# Patient Record
Sex: Male | Born: 1955 | Race: Black or African American | Hispanic: No | Marital: Married
Health system: Southern US, Community
[De-identification: ages and names within clinical notes are randomized; demographics above are authoritative.]

---

## 2003-05-11 ENCOUNTER — Encounter: Admission: RE | Admit: 2003-05-11 | Discharge: 2003-05-11 | Payer: Self-pay | Admitting: Occupational Medicine

## 2020-06-18 ENCOUNTER — Emergency Department (HOSPITAL_COMMUNITY)
Admission: EM | Admit: 2020-06-18 | Discharge: 2020-06-19 | Disposition: A | Discharge status: Home / Self Care | Payer: 59 | Attending: Emergency Medicine | Admitting: Emergency Medicine

## 2020-06-18 ENCOUNTER — Other Ambulatory Visit: Payer: Self-pay

## 2020-06-18 ENCOUNTER — Emergency Department (HOSPITAL_COMMUNITY): Payer: 59

## 2020-06-18 DIAGNOSIS — R42 Dizziness and giddiness: Secondary | ICD-10-CM | POA: Diagnosis not present

## 2020-06-18 DIAGNOSIS — R350 Frequency of micturition: Secondary | ICD-10-CM

## 2020-06-18 LAB — BASIC METABOLIC PANEL
Anion gap: 9 (ref 5–15)
BUN: 17 mg/dL (ref 8–23)
CO2: 26 mmol/L (ref 22–32)
Calcium: 9.5 mg/dL (ref 8.9–10.3)
Chloride: 105 mmol/L (ref 98–111)
Creatinine, Ser: 1.08 mg/dL (ref 0.61–1.24)
GFR, Estimated: >60 mL/min (ref >60)
Glucose, Bld: 112 mg/dL — ABNORMAL HIGH (ref 70–99)
Potassium: 4.4 mmol/L (ref 3.5–5.1)
Sodium: 140 mmol/L (ref 135–145)

## 2020-06-18 LAB — CBC
HCT: 43.1 % (ref 39.0–52.0)
Hemoglobin: 14.7 g/dL (ref 13.0–17.0)
MCH: 32 pg (ref 26.0–34.0)
MCHC: 34.1 g/dL (ref 30.0–36.0)
MCV: 93.9 fL (ref 80.0–100.0)
Platelets: 196 10*3/uL (ref 150–400)
RBC: 4.59 MIL/uL (ref 4.22–5.81)
RDW: 12.2 % (ref 11.5–15.5)
WBC: 6.1 10*3/uL (ref 4.0–10.5)
nRBC: 0 % (ref 0.0–0.2)

## 2020-06-18 IMAGING — MR MR HEAD W/O CM
12 of 13 series · 44 of 48 positions shown · non-contrast
Comparison: None.

CLINICAL DATA: 64-year-old male with dizziness for several days.
Symptoms exacerbated by bending.

EXAM:
MRI HEAD WITHOUT CONTRAST
TECHNIQUE: Multiplanar, multiecho pulse sequences of the brain and surrounding
structures were obtained without intravenous contrast.

[Series 5: DWI · axial · 3.0mm · 0.88mm/px · z∈[-128,+11]mm · 7 of 104 slices shown (1 of 4)]
[im 1/104]
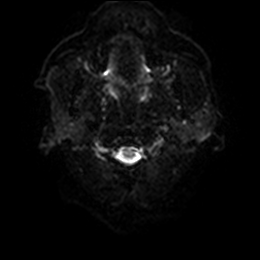
[im 18/104]
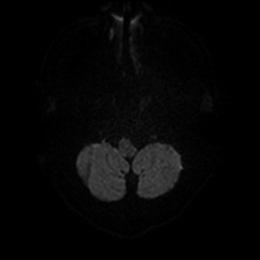
[im 35/104]
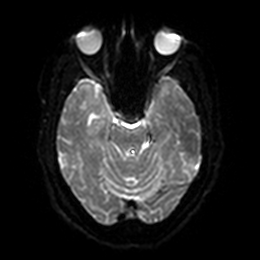
[im 52/104]
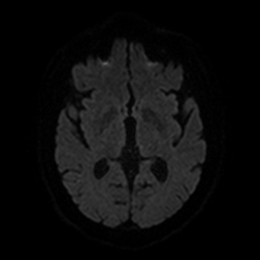
[im 69/104]
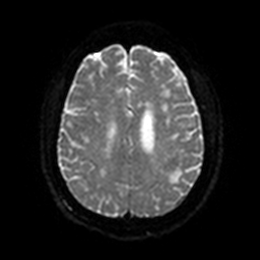
[im 86/104]
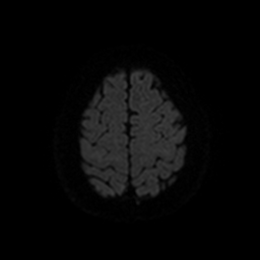
[im 104/104]
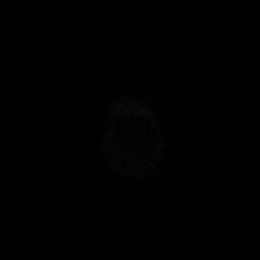

[Series 6: DWI · coronal · 4.0mm · 0.88mm/px · 6 of 76 slices shown (2 of 4)]
[im 1/76]
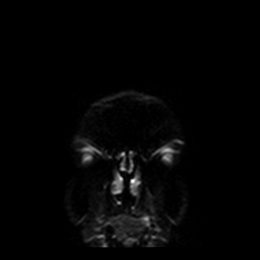
[im 16/76]
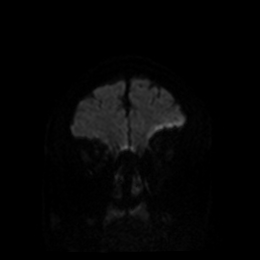
[im 31/76]
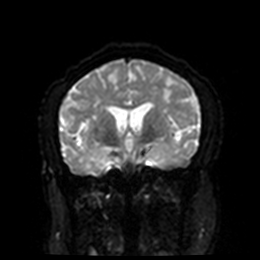
[im 46/76]
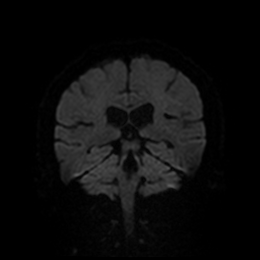
[im 61/76]
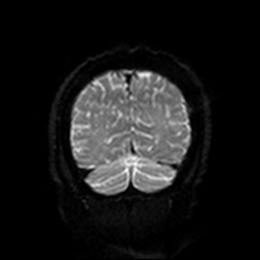
[im 76/76]
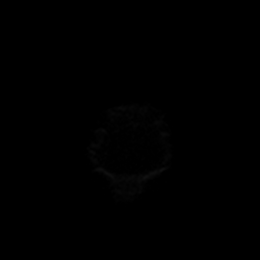

[Series 6: DWI · axial · 3.0mm · 0.88mm/px · z∈[-128,+11]mm · 4 of 52 slices shown (3 of 4)]
[im 1/52]
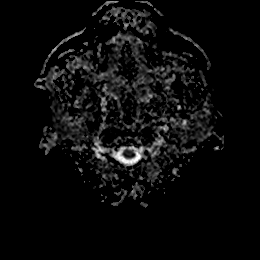
[im 18/52]
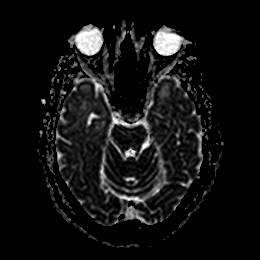
[im 35/52]
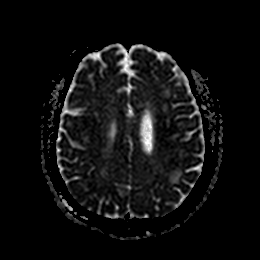
[im 52/52]
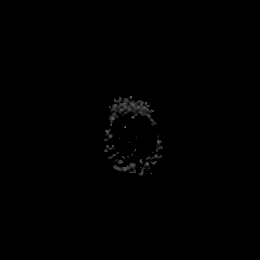

[Series 7: DWI · coronal · 4.0mm · 0.88mm/px · 3 of 38 slices shown (4 of 4)]
[im 1/38]
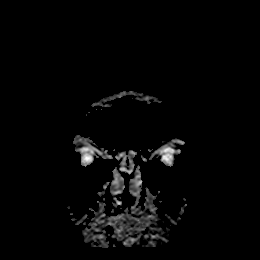
[im 19/38]
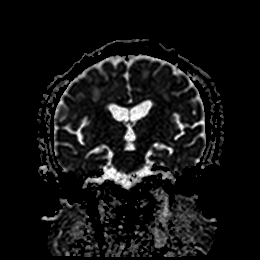
[im 38/38]
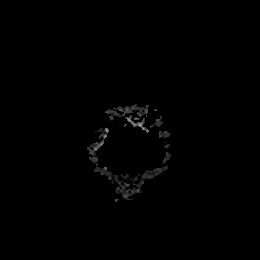

[Series 8: FLAIR · axial · 5.0mm · 0.45mm/px · z∈[-124,+7]mm · 2 of 25 slices shown]
[im 1/25]
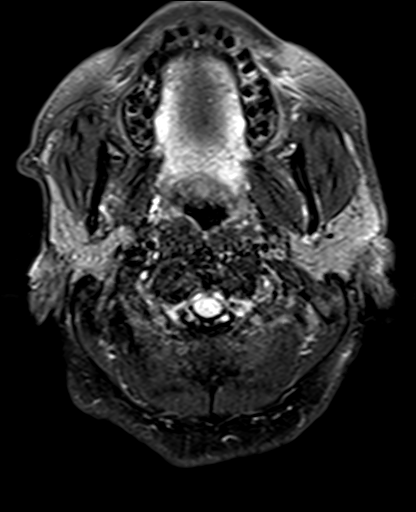
[im 25/25]
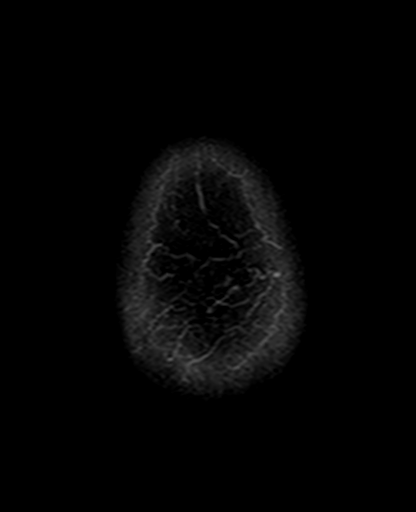

[Series 9: T1 · sagittal · 5.0mm · 0.75mm/px · 2 of 25 slices shown]
[im 1/25]
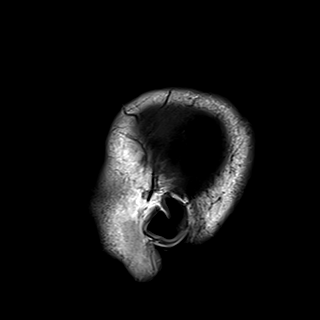
[im 25/25]
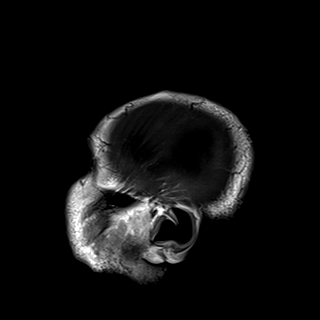

[Series 10: T2 · axial · 5.0mm · 0.72mm/px · z∈[-122,+8]mm · 2 of 25 slices shown (1 of 2)]
[im 1/25]
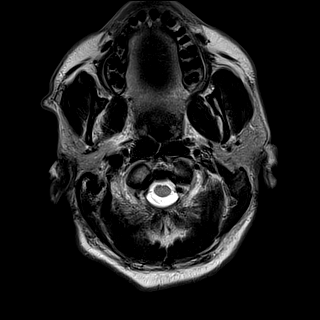
[im 25/25]
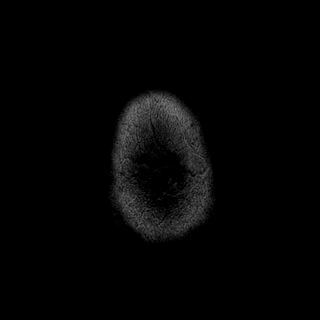

[Series 11: mag_images · axial · 3.0mm · 0.90mm/px · z∈[-134,+16]mm · 4 of 56 slices shown]
[im 1/56]
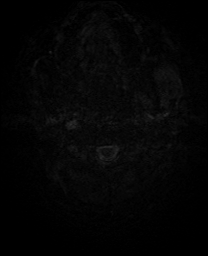
[im 19/56]
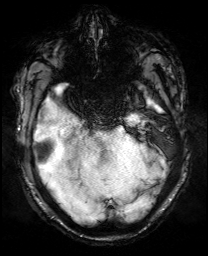
[im 37/56]
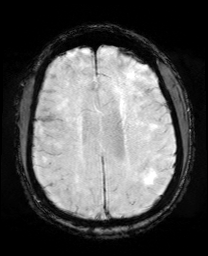
[im 56/56]
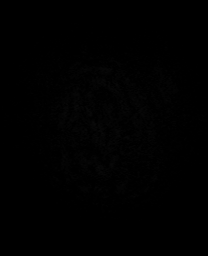

[Series 12: pha_images · axial · 3.0mm · 0.90mm/px · z∈[-134,+10]mm · 4 of 54 slices shown]
[im 1/54]
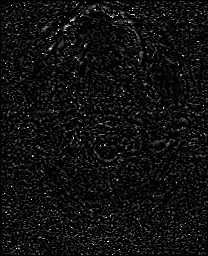
[im 18/54]
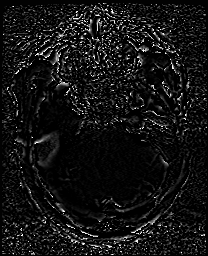
[im 36/54]
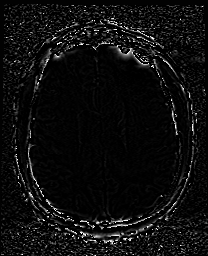
[im 54/54]
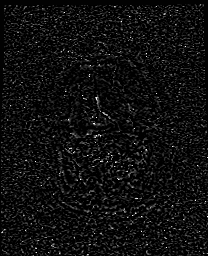

[Series 13: swi_images · axial · 3.0mm · 0.90mm/px · z∈[-134,+16]mm · 4 of 56 slices shown]
[im 1/56]
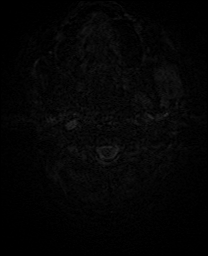
[im 19/56]
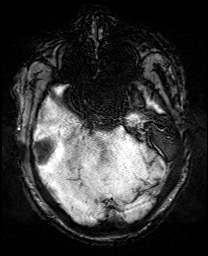
[im 37/56]
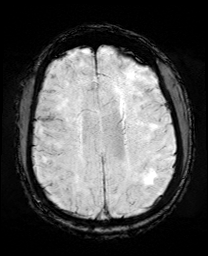
[im 56/56]
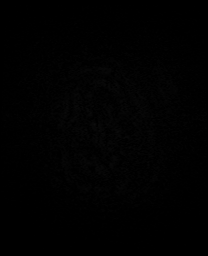

[Series 14: mip_images(sw) · axial · 24.0mm · 0.90mm/px · z∈[-125,+6]mm · 4 of 49 slices shown]
[im 1/49]
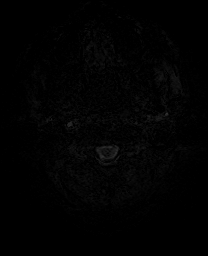
[im 17/49]
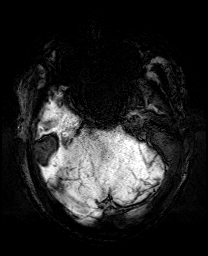
[im 33/49]
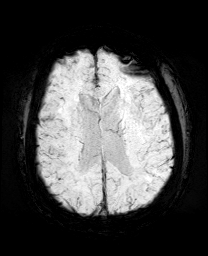
[im 49/49]
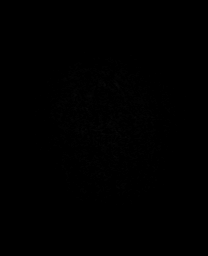

[Series 16: T2 · coronal · 5.0mm · 0.34mm/px · 2 of 31 slices shown (2 of 2)]
[im 1/31]
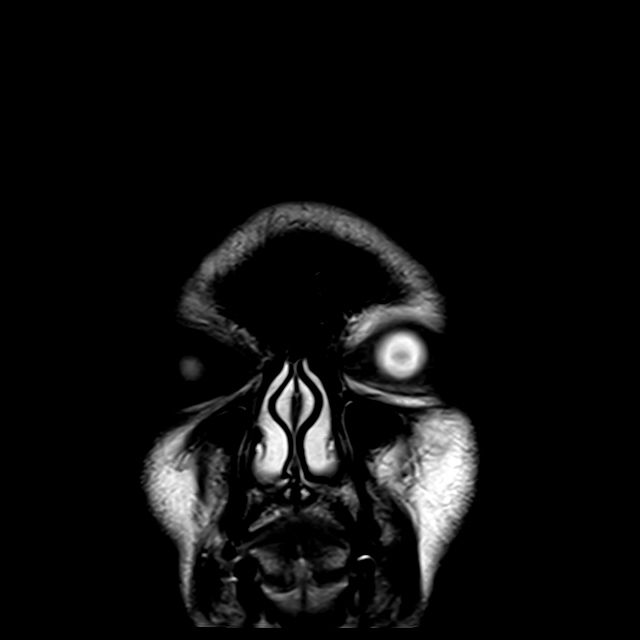
[im 31/31]
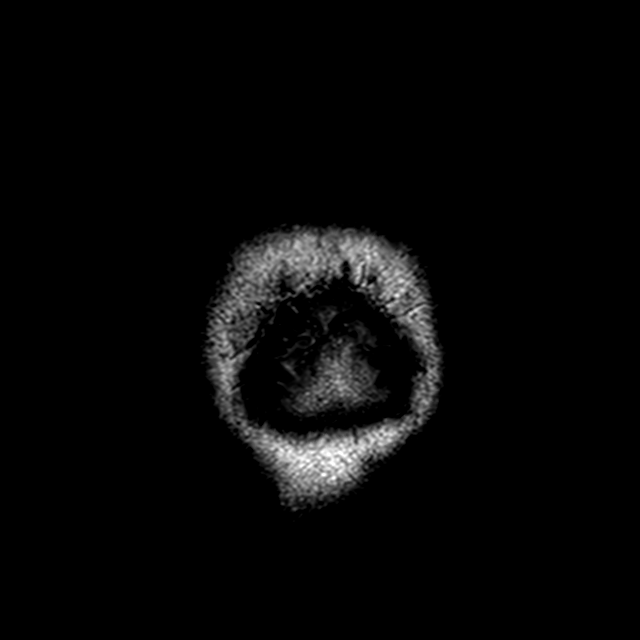

[44 of 48 positions shown; findings below may reference images not displayed]

FINDINGS: Brain: No restricted diffusion to suggest acute infarction. No
midline shift, mass effect, evidence of mass lesion,
ventriculomegaly, extra-axial collection or acute intracranial
hemorrhage. Cervicomedullary junction and pituitary are within
normal limits.

Cerebral volume is within normal limits for age.

Widely scattered, patchy bilateral cerebral white matter T2 and
FLAIR hyperintensity in a nonspecific pattern. Subcortical white
matter most affected. No cortical encephalomalacia or chronic
cerebral blood products identified. Deep gray matter nuclei,
brainstem and cerebellum appear normal.

Vascular: Major intracranial vascular flow voids are preserved. The
distal left vertebral artery appears mildly dominant.

Skull and upper cervical spine: Partially visible cervical spine
degeneration including disc space loss and degenerative appearing
marrow signal changes at C2-C3 and C3-C4. Up to mild associated
upper cervical spinal stenosis.

Skull bone marrow signal appears normal.

Sinuses/Orbits: Negative.

Other: Bilateral mastoid air cells are aerated. Visible internal
auditory structures appear normal. Stylomastoid foramen appear
normal. Visible scalp and face soft tissues appear negative.
IMPRESSION: 1. No acute intracranial abnormality.
2. Moderately advanced but nonspecific cerebral white matter signal
changes, most commonly due to chronic small vessel disease.
3. Partially visible cervical spine degeneration.

## 2020-06-18 IMAGING — MR MR MRA HEAD W/O CM
1 series · 19 of 48 positions shown · non-contrast
Comparison: Brain MRI, neck MRA today reported separately.

CLINICAL DATA: 64-year-old male with dizziness for several days.
Symptoms exacerbated by bending.

EXAM:
MRA HEAD WITHOUT CONTRAST
TECHNIQUE: Angiographic images of the Circle of Willis were obtained using MRA
technique without intravenous contrast.

[Series 7: 3d cow · axial · 0.5mm · 0.41mm/px · z∈[-117,-43]mm · 19 of 172 slices shown]
[im 1/172]
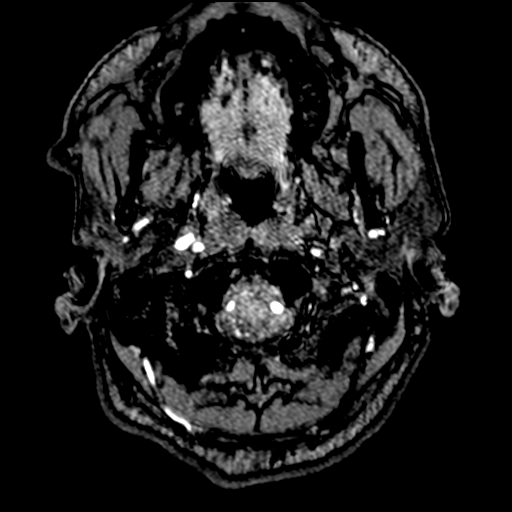
[im 4/172]
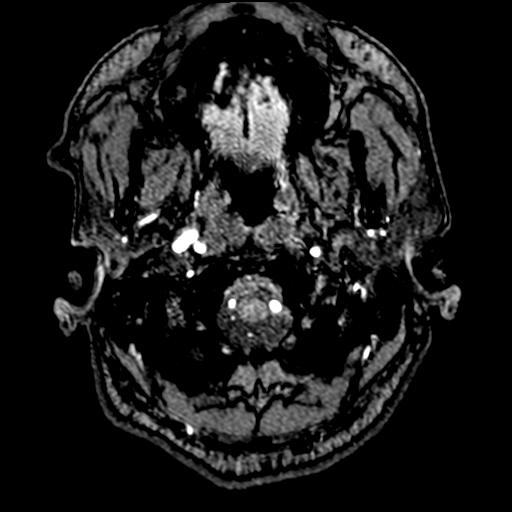
[im 8/172]
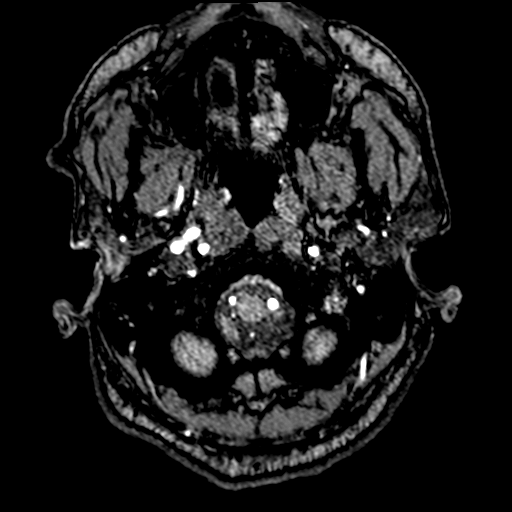
[im 11/172]
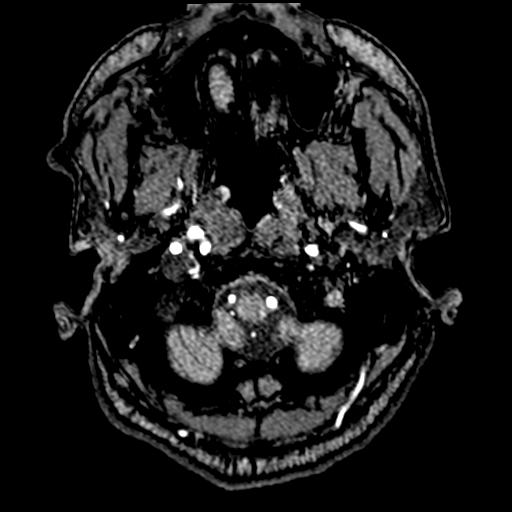
[im 15/172]
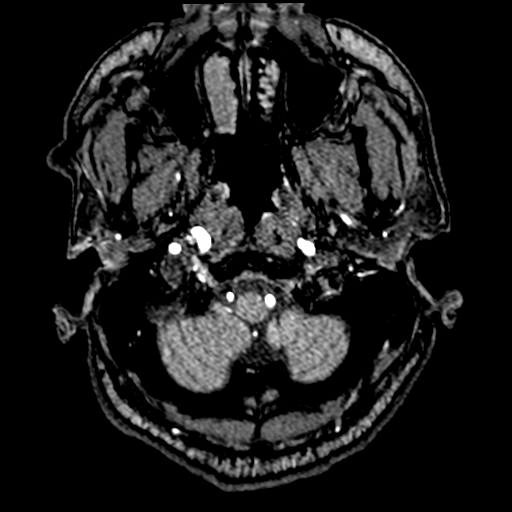
[im 19/172]
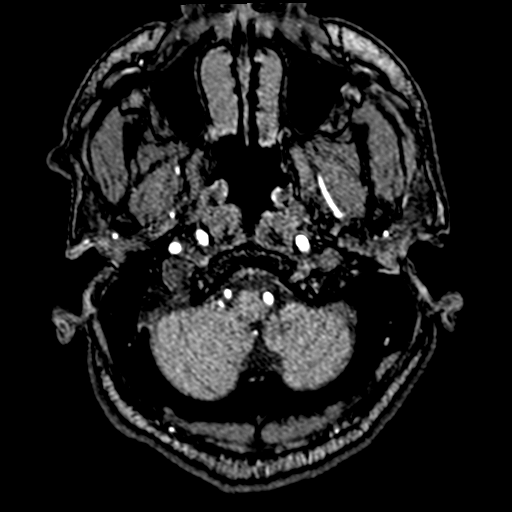
[im 22/172]
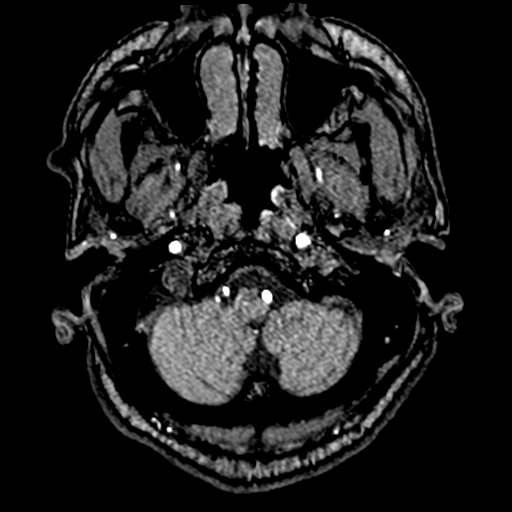
[im 26/172]
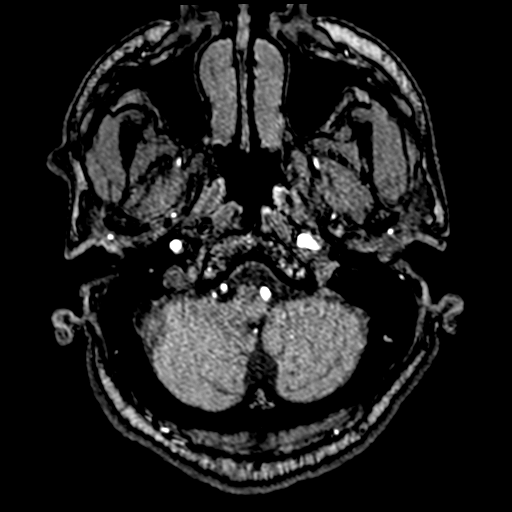
[im 30/172]
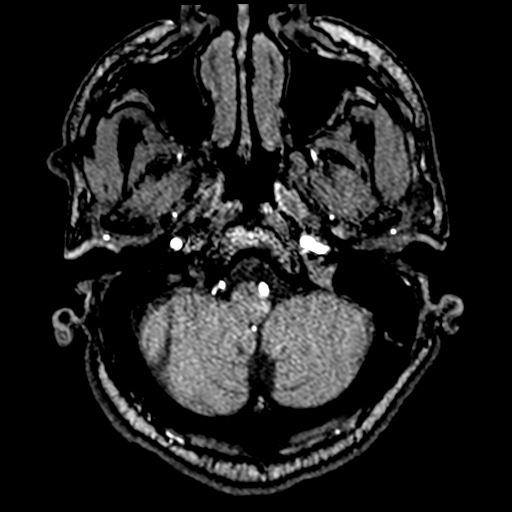
[im 33/172]
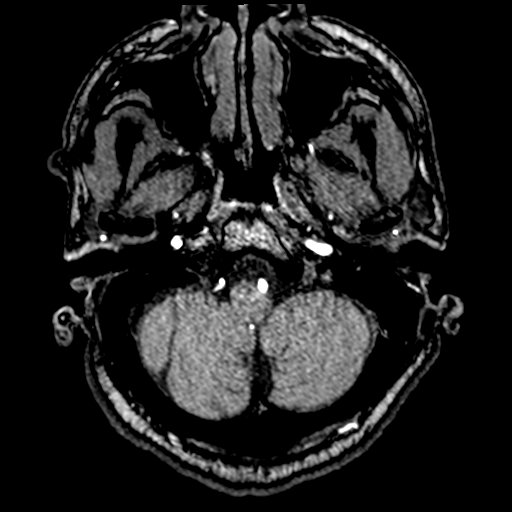
[im 37/172]
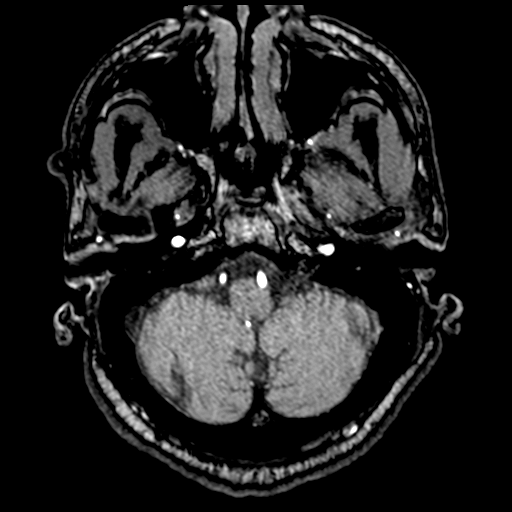
[im 55/172]
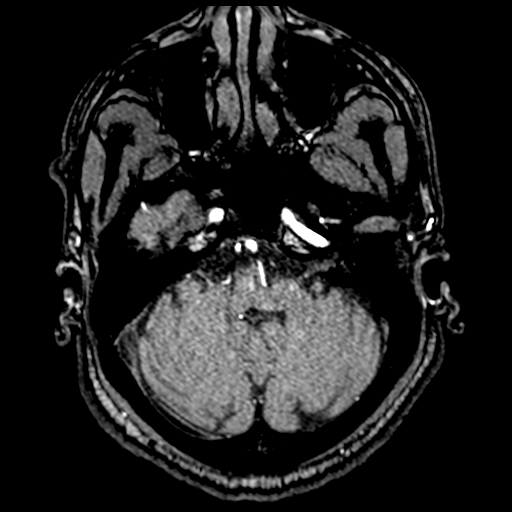
[im 77/172]
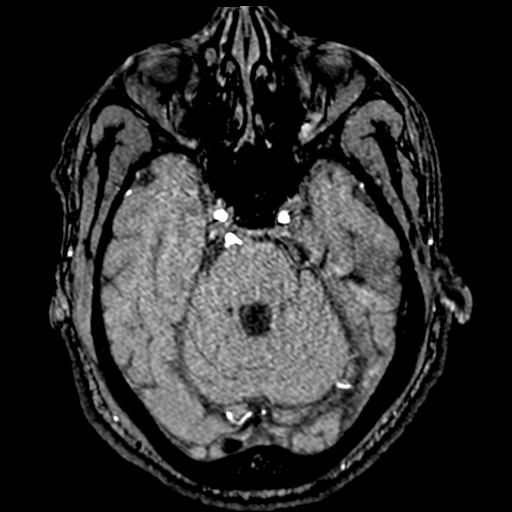
[im 88/172]
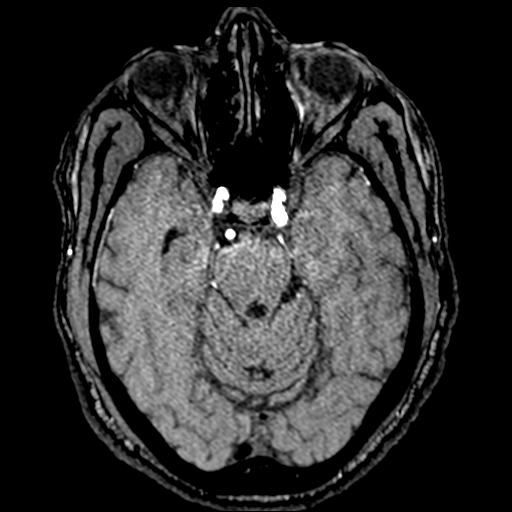
[im 99/172]
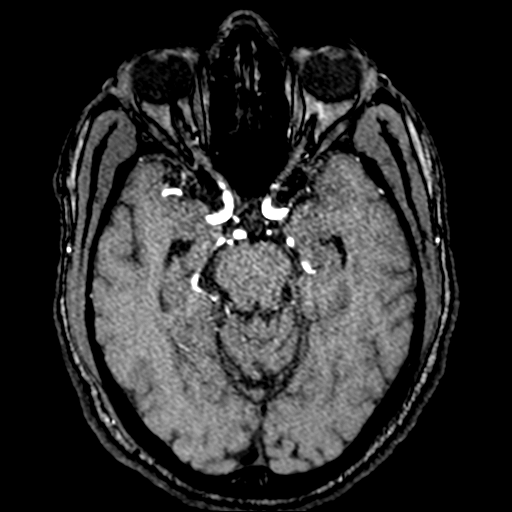
[im 121/172]
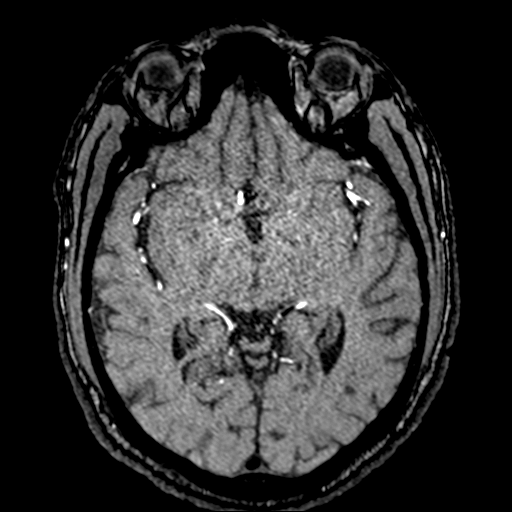
[im 142/172]
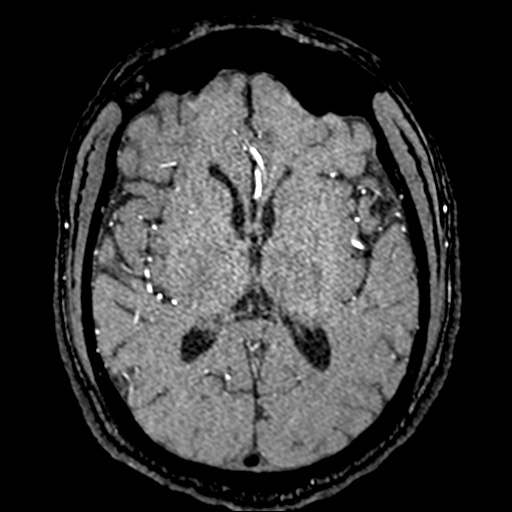
[im 146/172]
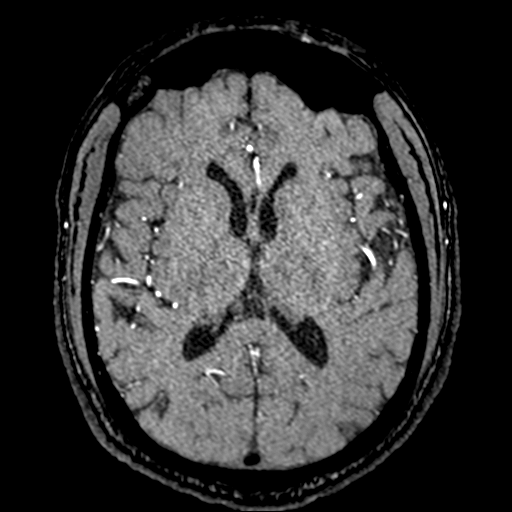
[im 164/172]
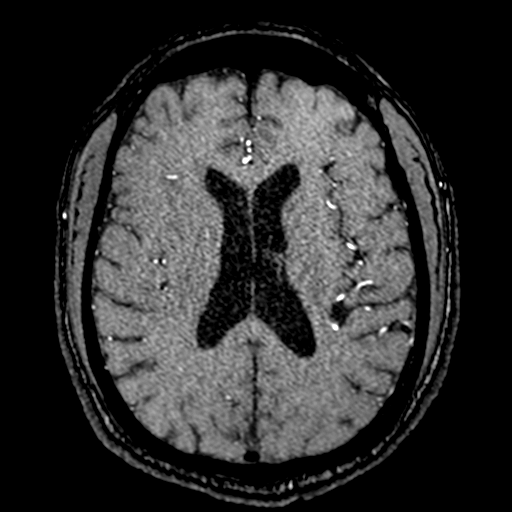

[19 of 48 positions shown; findings below may reference images not displayed]

FINDINGS: Antegrade flow in the posterior circulation with mildly dominant
distal left vertebral artery. No distal vertebral stenosis. Patent
vertebrobasilar junction. Normal right PICA origin, both AICA
origins are patent and the left may be dominant. No basilar
stenosis. Patent SCA and PCA origins. Diminutive bilateral posterior
communicating arteries. Bilateral PCA branches are within normal
limits.

Antegrade flow in both ICA siphons. Tortuous ICAs just below the
skull base more so the right. Irregularity of the bilateral
cavernous and supraclinoid siphons likely due to atherosclerosis.
Probable atherosclerotic pseudo lesion arising inferiorly from the
distal cavernous right ICA (series [Z2], image 3). Only mild siphon
stenosis on the left. Ophthalmic and posterior communicating artery
origins appear normal. Patent carotid termini. Normal MCA and ACA
origins.

Tortuous left A1 segment. Diminutive or absent anterior
communicating artery. Visible ACA branches are within normal limits.
MCA M1 segments and bifurcations are patent without stenosis.
Visible bilateral MCA branches are within normal limits.
IMPRESSION: 1. Evidence of bilateral ICA siphon atherosclerosis, but only mild
stenosis on the Left.
2. Otherwise negative intracranial MRA.

## 2020-06-18 IMAGING — MR MR MRA NECK WO/W CM
8 series · 43 of 48 positions shown · IV contrast (Gadavist)
Comparison: Brain MRI today reported separately.

CLINICAL DATA: 64-year-old male with dizziness for several days.
Symptoms exacerbated by bending.

EXAM:
MRA NECK WITHOUT AND WITH CONTRAST
TECHNIQUE: Multiplanar and multiecho pulse sequences of the neck were obtained
without and with intravenous contrast. Angiographic images of the
neck were obtained using MRA technique without and with intravenous
contrast.
CONTRAST:  7.5mL GADAVIST GADOBUTROL 1 MMOL/ML IV SOLN

[Series 9: tof_fl3d_tra_iso · axial · 0.6mm · 0.52mm/px · z∈[-176,-94]mm · 8 of 146 slices shown]
[im 1/146]
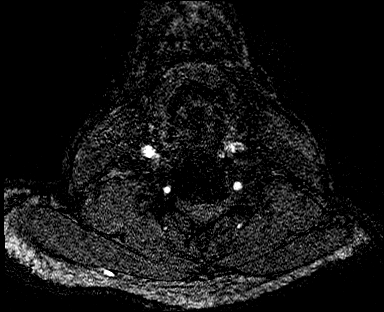
[im 21/146]
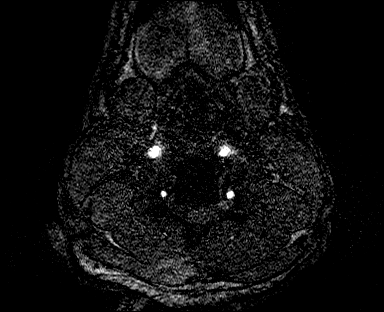
[im 42/146]
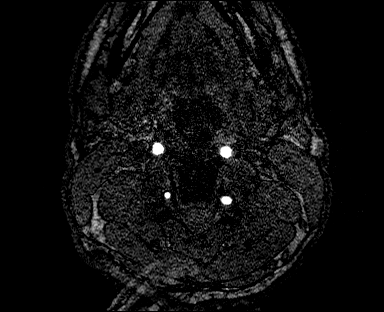
[im 63/146]
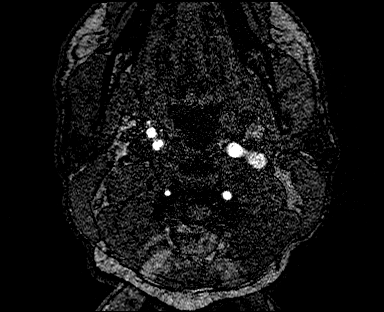
[im 83/146]
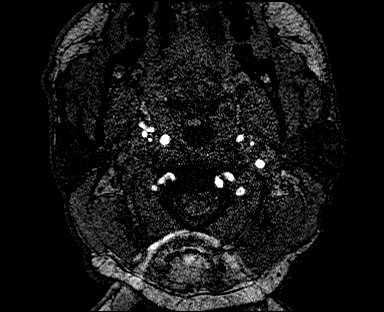
[im 104/146]
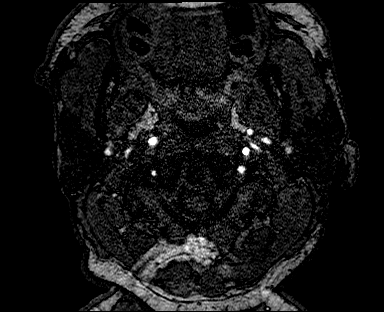
[im 125/146]
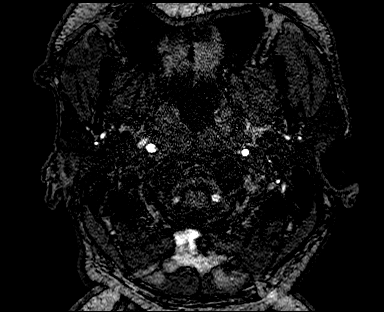
[im 146/146]
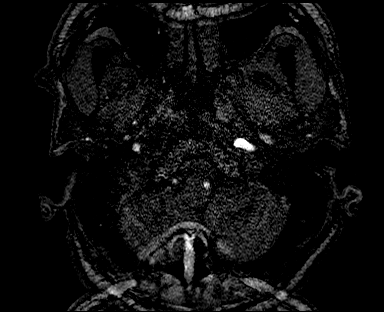

[Series 12: angio_fl3d_cor_pre_ttc=3.0s · coronal · 0.9mm · 0.85mm/px · 5 of 96 slices shown]
[im 1/96]
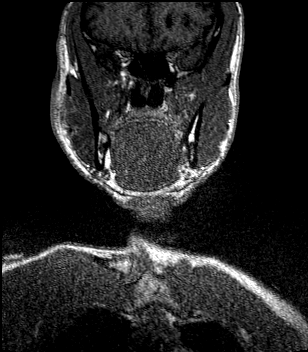
[im 24/96]
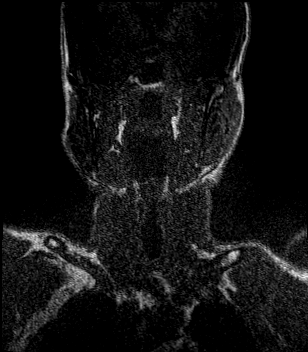
[im 48/96]
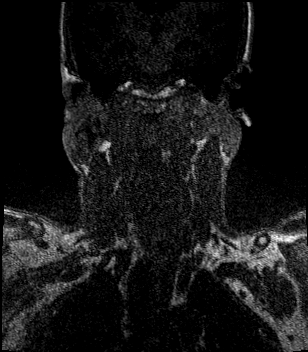
[im 72/96]
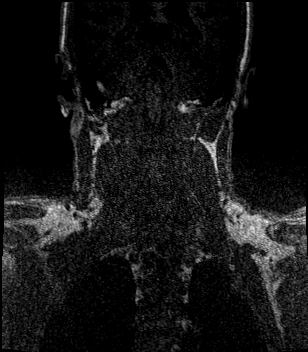
[im 96/96]
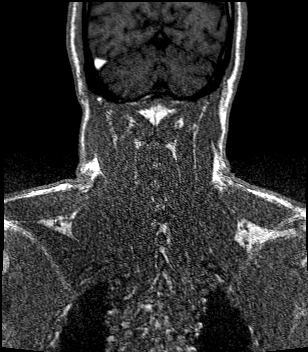

[Series 14: angio_fl3d_cor_post_ttc=3.0s · coronal · 0.9mm · 0.85mm/px · 5 of 96 slices shown (1 of 2)]
[im 1/96]
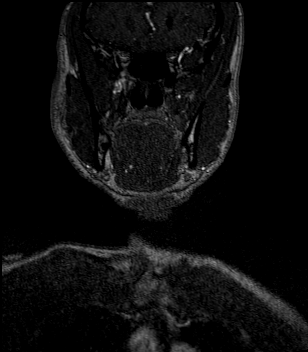
[im 24/96]
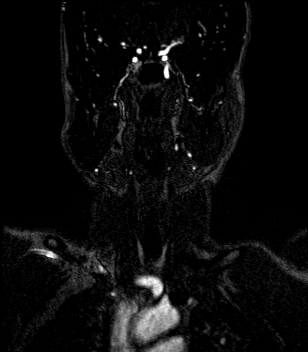
[im 48/96]
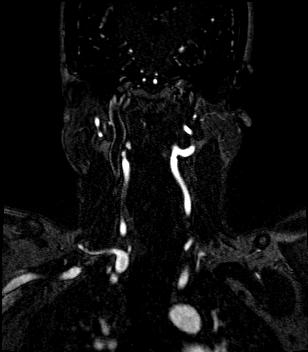
[im 72/96]
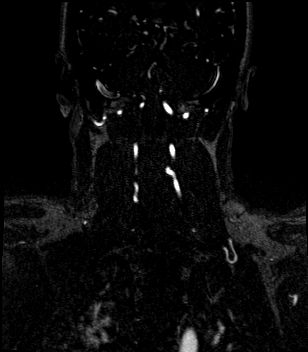
[im 96/96]
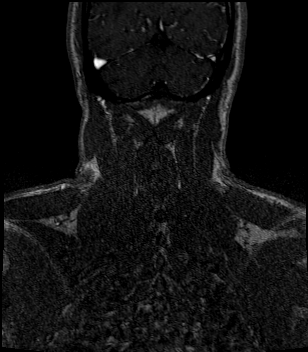

[Series 15: angio_fl3d_cor_post_ttc=3.0s_moco-adv · coronal · 0.9mm · 0.85mm/px · 6 of 96 slices shown (1 of 2)]
[im 1/96]
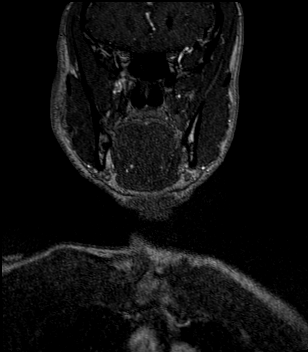
[im 20/96]
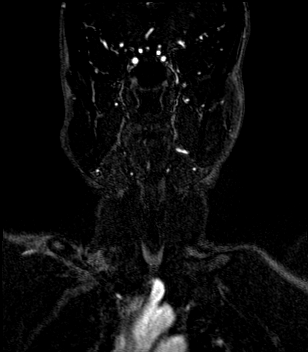
[im 39/96]
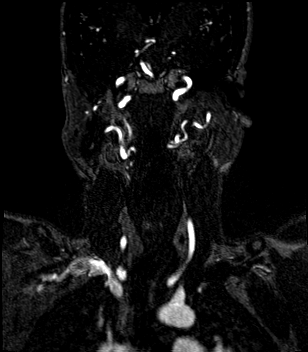
[im 58/96]
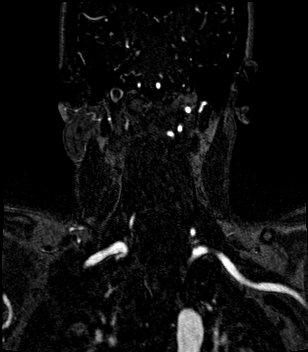
[im 77/96]
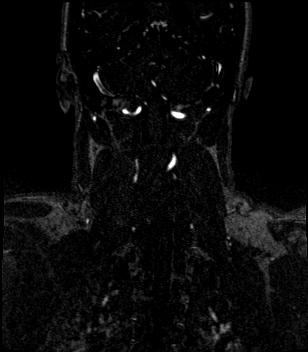
[im 96/96]
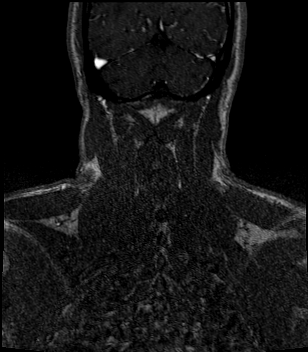

[Series 16: angio_fl3d_cor_post_ttc=3.0s_moco-adv_sub · coronal · 0.9mm · 0.85mm/px · 6 of 94 slices shown (1 of 2)]
[im 1/94]
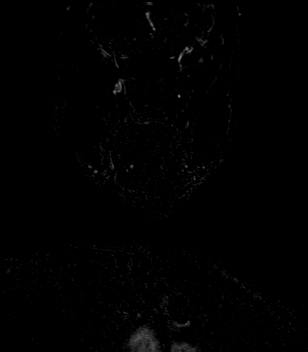
[im 19/94]
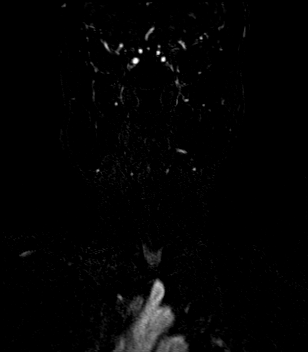
[im 38/94]
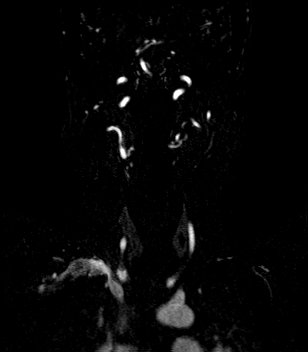
[im 56/94]
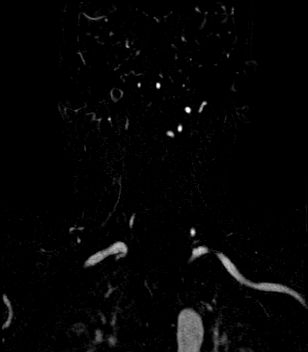
[im 75/94]
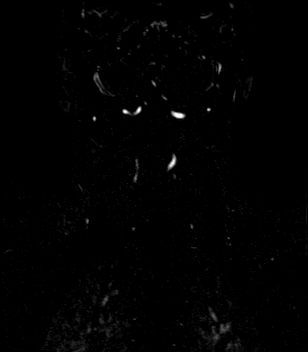
[im 94/94]
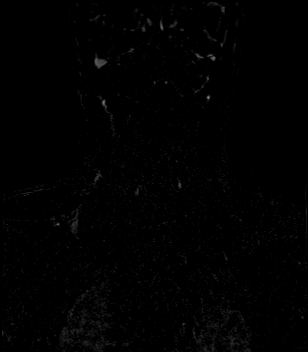

[Series 18: angio_fl3d_cor_post_ttc=3.0s · coronal · 0.9mm · 0.85mm/px · 6 of 96 slices shown (2 of 2)]
[im 1/96]
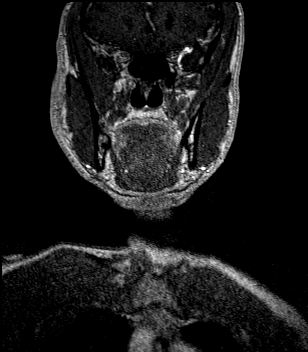
[im 20/96]
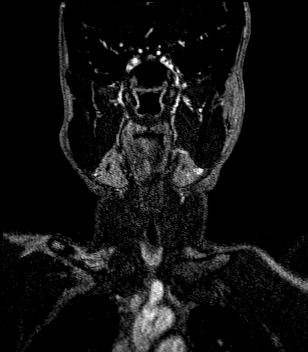
[im 39/96]
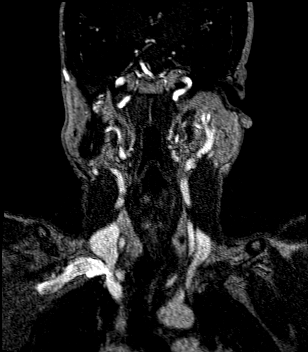
[im 58/96]
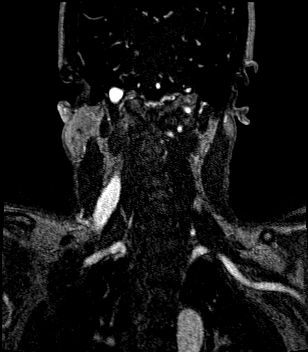
[im 77/96]
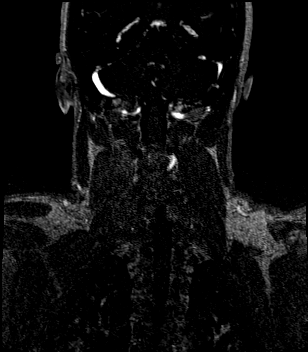
[im 96/96]
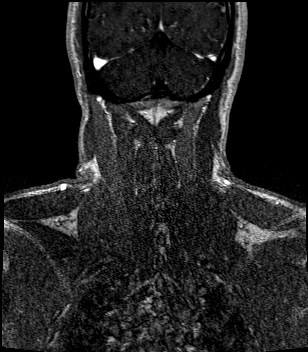

[Series 19: angio_fl3d_cor_post_ttc=3.0s_moco-adv · coronal · 0.9mm · 0.85mm/px · 6 of 96 slices shown (2 of 2)]
[im 1/96]
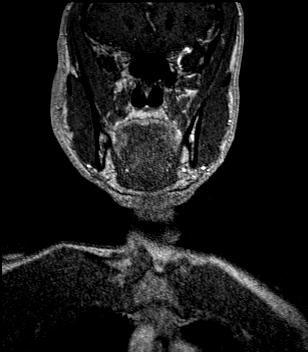
[im 20/96]
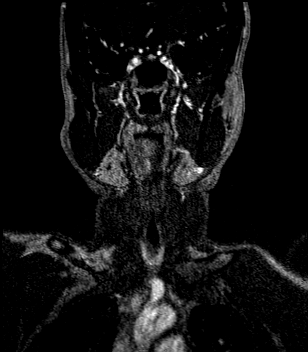
[im 39/96]
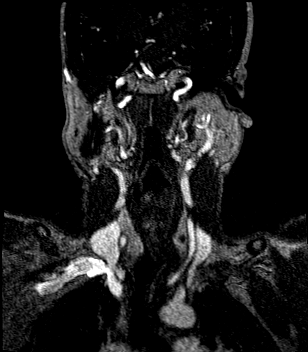
[im 58/96]
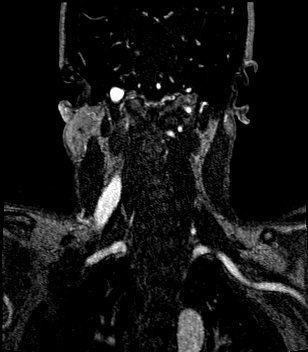
[im 77/96]
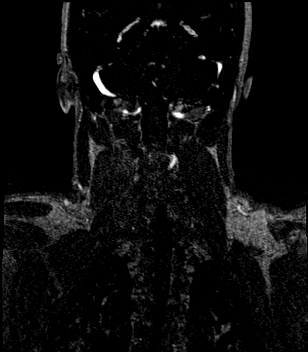
[im 96/96]
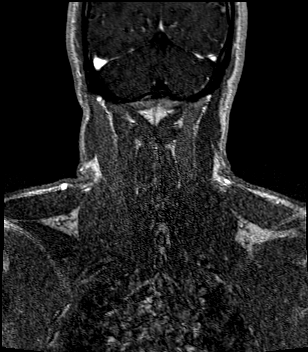

[Series 20: angio_fl3d_cor_post_ttc=3.0s_moco-adv_sub · coronal · 0.9mm · 0.85mm/px · 1 of 96 slices shown (2 of 2)]
[im 1/96]
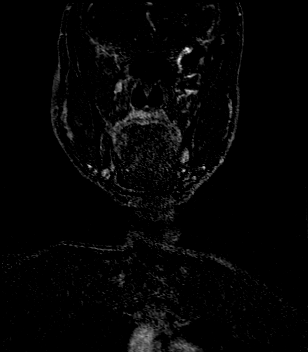

[43 of 48 positions shown; findings below may reference images not displayed]

FINDINGS: Pre contrast time-of-flight images demonstrate antegrade flow signal
in both cervical carotid and vertebral arteries to the skull base.
Left vertebral appears mildly dominant. Carotid bifurcations appear
within normal limits.

Post-contrast neck MRA images reveal a 3 vessel arch configuration.
Normal proximal great vessels.

Mildly irregular proximal right CCA compatible with atherosclerosis
but no associated stenosis. Filling defect at the posterior right
ICA origin compatible with atherosclerosis, but less than 50 %
stenosis with respect to the distal vessel. More normal appearance
of the distal right ICA bulb. Mid cervical right ICA is highly
tortuous, but there is no stenosis to the skull base. Visible right
anterior circulation grossly negative.

Left CCA within normal limits. Capacious left carotid bifurcation
and tortuous proximal left ICA without stenosis. Moderately tortuous
mid cervical left ICA. Grossly negative visible left anterior
circulation.

No significant proximal subclavian artery stenosis. Both vertebral
artery origins are patent with mild irregularity. The left vertebral
artery is dominant to the skull base. Mildly tortuous V2 segments
but no hemodynamically significant cervical vertebral stenosis
suspected. Grossly negative visible posterior circulation.
IMPRESSION: 1. Atherosclerosis in the neck most pronounced at the Right ICA
origin, but no hemodynamically significant stenosis identified.
2. Tortuous cervical ICAs.  Mildly dominant Left Vertebral Artery.

## 2020-06-18 MED ORDER — GADOBUTROL 1 MMOL/ML IV SOLN
7.5000 mL | Freq: Once | INTRAVENOUS | Status: AC | PRN
  Administered 2020-06-18: 7.5 mL via INTRAVENOUS

## 2020-06-18 NOTE — ED Triage Notes (Signed)
Pt here from work, sent by his PCP for eval of dizziness over several days, especially when bending over the pick something up or when standing and concentrating on one object. Denies syncope.

## 2020-06-18 NOTE — ED Provider Notes (Signed)
[facility] Note   CSN: [id number] Arrival date & time: 06/18/20  1237     History Chief Complaint  Patient presents with  . Dizziness    Leamon Margarette [place] is a 64 y.o. male.  The history is provided by the patient and medical records. No language interpreter was used.  Dizziness Quality:  Head spinning, vertigo and imbalance Severity:  Moderate Onset quality:  Sudden Duration:  1 day Timing:  Sporadic Progression:  Resolved Chronicity:  Recurrent Context: bending over   Relieved by:  Nothing Worsened by:  Nothing Ineffective treatments:  None tried Associated symptoms: no chest pain, no diarrhea, no headaches, no nausea, no palpitations, no shortness of breath, no syncope, no tinnitus, no vision changes, no vomiting and no weakness   Risk factors: no hx of stroke and no hx of vertigo        No past medical history on file.  There are no problems to display for this patient.    No family history on file.  Social History   Tobacco Use  . Smoking status: Not on file  Substance Use Topics  . Alcohol use: Not on file  . Drug use: Not on file    Home Medications Prior to Admission medications   Not on File    Allergies    Patient has no known allergies.  Review of Systems   Review of Systems  Constitutional: Negative for chills, diaphoresis, fatigue and fever.  HENT: Negative for congestion and tinnitus.   Eyes: Negative for photophobia and visual disturbance.  Respiratory: Negative for cough, chest tightness, shortness of breath and wheezing.   Cardiovascular: Negative for chest pain, palpitations, leg swelling and syncope.  Gastrointestinal: Negative for abdominal pain, constipation, diarrhea, nausea and vomiting.  Genitourinary: Negative for dysuria, flank pain and frequency.  Musculoskeletal: Negative for back pain, neck pain and neck stiffness.  Skin: Negative for wound.  Neurological: Positive for  dizziness. Negative for tremors, seizures, syncope, speech difficulty, weakness, light-headedness, numbness and headaches.  Psychiatric/Behavioral: Negative for agitation and confusion.  All other systems reviewed and are negative.   Physical Exam Updated Vital Signs BP (!) 203/98 (BP Location: Right Arm)   Pulse 92   Temp 98.6 F (37 C) (Oral)   Resp 20   SpO2 100%   Physical Exam Vitals and nursing note reviewed.  Constitutional:      General: He is not in acute distress.    Appearance: He is well-developed. He is not ill-appearing, toxic-appearing or diaphoretic.  HENT:     Head: Normocephalic and atraumatic.     Nose: No congestion.     Mouth/Throat:     Mouth: Mucous membranes are moist.     Pharynx: No oropharyngeal exudate or posterior oropharyngeal erythema.  Eyes:     Extraocular Movements: Extraocular movements intact.     Conjunctiva/sclera: Conjunctivae normal.     Pupils: Pupils are equal, round, and reactive to light.  Cardiovascular:     Rate and Rhythm: Normal rate and regular rhythm.     Pulses: Normal pulses.     Heart sounds: No murmur heard.   Pulmonary:     Effort: Pulmonary effort is normal. No respiratory distress.     Breath sounds: Normal breath sounds. No wheezing, rhonchi or rales.  Chest:     Chest wall: No tenderness.  Abdominal:     General: Abdomen is flat.     Palpations: Abdomen is soft.  Tenderness: There is no abdominal tenderness. There is no right CVA tenderness, left CVA tenderness, guarding or rebound.  Musculoskeletal:        General: No tenderness or signs of injury.     Cervical back: Neck supple. No tenderness.     Right lower leg: No edema.     Left lower leg: No edema.  Skin:    General: Skin is warm and dry.     Capillary Refill: Capillary refill takes less than 2 seconds.     Findings: No erythema.  Neurological:     General: No focal deficit present.     Mental Status: He is alert and oriented to person, place,  and time.     Cranial Nerves: No cranial nerve deficit.     Sensory: No sensory deficit.     Motor: No weakness.     Coordination: Coordination normal.     Gait: Gait normal.  Psychiatric:        Mood and Affect: Mood normal.     ED Results / Procedures / Treatments   Labs (all labs ordered are listed, but only abnormal results are displayed) Labs Reviewed  BASIC METABOLIC PANEL - Abnormal; Notable for the following components:      Result Value   Glucose, Bld 112 (*)    All other components within normal limits  URINE CULTURE  CBC  URINALYSIS, ROUTINE W REFLEX MICROSCOPIC    EKG EKG Interpretation  Date/Time:  Tuesday June 18 2020 13:08:27 EST Ventricular Rate:  88 PR Interval:  176 QRS Duration: 78 QT Interval:  344 QTC Calculation: 416 R Axis:   59 Text Interpretation: Normal sinus rhythm Possible Left atrial enlargement Borderline ECG No prior ECG for comparison. No STEMI Confirmed by Theda Belfast ([id number]) on 06/18/2020 6:41:19 PM   Radiology MR ANGIO HEAD WO CONTRAST  Result Date: 06/18/2020 CLINICAL DATA:  64 year old male with dizziness for several days. Symptoms exacerbated by bending. EXAM: MRA HEAD WITHOUT CONTRAST TECHNIQUE: Angiographic images of the Circle of Willis were obtained using MRA technique without intravenous contrast. COMPARISON:  Brain MRI, neck MRA today reported separately. FINDINGS: Antegrade flow in the posterior circulation with mildly dominant distal left vertebral artery. No distal vertebral stenosis. Patent vertebrobasilar junction. Normal right PICA origin, both AICA origins are patent and the left may be dominant. No basilar stenosis. Patent SCA and PCA origins. Diminutive bilateral posterior communicating arteries. Bilateral PCA branches are within normal limits. Antegrade flow in both ICA siphons. Tortuous ICAs just below the skull base more so the right. Irregularity of the bilateral cavernous and supraclinoid siphons likely due to  atherosclerosis. Probable atherosclerotic pseudo lesion arising inferiorly from the distal cavernous right ICA (series 1052, image 3). Only mild siphon stenosis on the left. Ophthalmic and posterior communicating artery origins appear normal. Patent carotid termini. Normal MCA and ACA origins. Tortuous left A1 segment. Diminutive or absent anterior communicating artery. Visible ACA branches are within normal limits. MCA M1 segments and bifurcations are patent without stenosis. Visible bilateral MCA branches are within normal limits. IMPRESSION: 1. Evidence of bilateral ICA siphon atherosclerosis, but only mild stenosis on the Left. 2. Otherwise negative intracranial MRA. Electronically Signed   By: Odessa Fleming M.D.   On: 06/18/2020 21:07   MR Angiogram Neck W or Wo Contrast  Result Date: 06/18/2020 CLINICAL DATA:  64 year old male with dizziness for several days. Symptoms exacerbated by bending. EXAM: MRA NECK WITHOUT AND WITH CONTRAST TECHNIQUE: Multiplanar and multiecho pulse sequences of the  neck were obtained without and with intravenous contrast. Angiographic images of the neck were obtained using MRA technique without and with intravenous contrast. CONTRAST:  7.43mL GADAVIST GADOBUTROL 1 MMOL/ML IV SOLN COMPARISON:  Brain MRI today reported separately. FINDINGS: Pre contrast time-of-flight images demonstrate antegrade flow signal in both cervical carotid and vertebral arteries to the skull base. Left vertebral appears mildly dominant. Carotid bifurcations appear within normal limits. Post-contrast neck MRA images reveal a 3 vessel arch configuration. Normal proximal great vessels. Mildly irregular proximal right CCA compatible with atherosclerosis but no associated stenosis. Filling defect at the posterior right ICA origin compatible with atherosclerosis, but less than 50 % stenosis with respect to the distal vessel. More normal appearance of the distal right ICA bulb. Mid cervical right ICA is highly tortuous,  but there is no stenosis to the skull base. Visible right anterior circulation grossly negative. Left CCA within normal limits. Capacious left carotid bifurcation and tortuous proximal left ICA without stenosis. Moderately tortuous mid cervical left ICA. Grossly negative visible left anterior circulation. No significant proximal subclavian artery stenosis. Both vertebral artery origins are patent with mild irregularity. The left vertebral artery is dominant to the skull base. Mildly tortuous V2 segments but no hemodynamically significant cervical vertebral stenosis suspected. Grossly negative visible posterior circulation. IMPRESSION: 1. Atherosclerosis in the neck most pronounced at the Right ICA origin, but no hemodynamically significant stenosis identified. 2. Tortuous cervical ICAs.  Mildly dominant Left Vertebral Artery. Electronically Signed   By: Odessa Fleming M.D.   On: 06/18/2020 21:03   MR BRAIN WO CONTRAST  Result Date: 06/18/2020 CLINICAL DATA:  64 year old male with dizziness for several days. Symptoms exacerbated by bending. EXAM: MRI HEAD WITHOUT CONTRAST TECHNIQUE: Multiplanar, multiecho pulse sequences of the brain and surrounding structures were obtained without intravenous contrast. COMPARISON:  None. FINDINGS: Brain: No restricted diffusion to suggest acute infarction. No midline shift, mass effect, evidence of mass lesion, ventriculomegaly, extra-axial collection or acute intracranial hemorrhage. Cervicomedullary junction and pituitary are within normal limits. Cerebral volume is within normal limits for age. Widely scattered, patchy bilateral cerebral white matter T2 and FLAIR hyperintensity in a nonspecific pattern. Subcortical white matter most affected. No cortical encephalomalacia or chronic cerebral blood products identified. Deep gray matter nuclei, brainstem and cerebellum appear normal. Vascular: Major intracranial vascular flow voids are preserved. The distal left vertebral artery  appears mildly dominant. Skull and upper cervical spine: Partially visible cervical spine degeneration including disc space loss and degenerative appearing marrow signal changes at C2-C3 and C3-C4. Up to mild associated upper cervical spinal stenosis. Skull bone marrow signal appears normal. Sinuses/Orbits: Negative. Other: Bilateral mastoid air cells are aerated. Visible internal auditory structures appear normal. Stylomastoid foramen appear normal. Visible scalp and face soft tissues appear negative. IMPRESSION: 1. No acute intracranial abnormality. 2. Moderately advanced but nonspecific cerebral white matter signal changes, most commonly due to chronic small vessel disease. 3. Partially visible cervical spine degeneration. Electronically Signed   By: Odessa Fleming M.D.   On: 06/18/2020 20:58    Procedures Procedures (including critical care time)  Medications Ordered in ED Medications  gadobutrol (GADAVIST) 1 MMOL/ML injection 7.5 mL (7.5 mLs Intravenous Contrast Given 06/18/20 2036)    ED Course  I have reviewed the triage vital signs and the nursing notes.  Pertinent labs & imaging results that were available during my care of the patient were reviewed by me and considered in my medical decision making (see chart for details).    MDM Rules/Calculators/A&P  Hesston Hitchens is a 64 y.o. male with no significant past medical history who presents at the direction of his PCP for evaluation of dizziness.  Patient says that 3 weeks ago, he had an episode lasting around a minute of unsteadiness and dizziness.  He says that it went away.  He reports that today, he has had several episodes of this unsteady dizziness and had to hold onto something so he did not fall over.  He reports it was worsened based on how his head was positioned when he was bending forward.  He reports no recent head trauma.  He denies any speech difficulties or vision changes.  He reports no significant nausea or  vomiting.  He went to see his PCP today who assessed the patient and when the patient described his symptoms he was concerned he may have had TIA or stroke.  He was sent to the emergency department evaluation.  Of note, patient's blood pressure was over 200 systolic when he arrived today.  Patient has no history of hypertension.  On exam, lungs are clear and chest is nontender.  Abdomen is nontender.  Good pulses in extremities.  There were no focal neurologic deficits on muscle exam with normal sensation and strength in all extremities.  Normal gait.  I cannot provoke his dizziness with head positioning.  No carotid bruit.  No difficulty with speech.  No vision changes.  Normal extraocular movements.  Exam otherwise unremarkable.  I spoke with neurology who given the patient having symptoms 3 weeks ago and then recurrent episodes today in the setting of elevated blood pressures, they do feel MRI and MRA of head and neck is appropriate to rule out stroke, TIA, or any vascular abnormality going to his head.  We will get these and get screening labs.  Will also get urinalysis as he reports that he had some urinary frequency recently.  Make sure he does not have a UTI.  Patient is in agreement with plan, anticipate follow-up after MRI is completed  MRI returned and showed no acute stroke.  I discussed this with the patient.  The MRA of the neck showed atherosclerosis most pronounced in the right ICA but no hemodynamically significant stenosis identified.  There were tortuous cervical ICAs as well..  There was some white matter changes and likely chronic small vessel disease.  Patient is still waiting on urinalysis which she is trying to collect for [place].  His blood pressure has done better here in the emergency department is still elevated without history of hypertension.  Care transferred to oncoming team while waiting for results of urinalysis to make sure he does not need antibiotics and he will need to  follow-up with PCP for further blood pressure management and monitoring.  Given his resolved symptoms, do not feel needs admission at this time.  He would likely need to be started on meclizine for the vertigo and some blood pressure medicine and possible antibiotics if his urine looks infected.  Patient is agreement with this plan and anticipate discharge after urinalysis.   Final Clinical Impression(s) / ED Diagnoses Final diagnoses:  Dizziness  Urinary frequency     Clinical Impression: 1. Dizziness   2. Urinary frequency     Disposition: Reassess after urinalysis.  This note was prepared with assistance of Conservation officer, historic buildings. Occasional wrong-word or sound-a-like substitutions may have occurred due to the inherent limitations of voice recognition software.      Kelie Gainey, Canary Brim, MD 06/18/20 2352

## 2020-06-18 NOTE — ED Notes (Signed)
Patient transported to MRI 

## 2020-06-19 LAB — URINALYSIS, ROUTINE W REFLEX MICROSCOPIC
Bilirubin Urine: NEGATIVE
Glucose, UA: NEGATIVE mg/dL
Hgb urine dipstick: NEGATIVE
Ketones, ur: NEGATIVE mg/dL
Leukocytes,Ua: NEGATIVE
Nitrite: NEGATIVE
Protein, ur: NEGATIVE mg/dL
Specific Gravity, Urine: 1.02 (ref 1.005–1.030)
pH: 6 (ref 5.0–8.0)

## 2020-06-19 MED ORDER — MECLIZINE HCL 50 MG PO TABS: 50.0000 mg | ORAL_TABLET | Freq: Two times a day (BID) | ORAL | 0 refills | Status: AC | PRN

## 2020-06-19 MED ORDER — AMLODIPINE BESYLATE 5 MG PO TABS: 5.0000 mg | ORAL_TABLET | Freq: Every day | ORAL | 0 refills | Status: AC

## 2020-06-19 NOTE — ED Provider Notes (Signed)
  Physical Exam  BP (!) 150/75   Pulse 78   Temp 98.3 F (36.8 C) (Oral)   Resp 18   SpO2 99%   Physical Exam  ED Course/Procedures     Procedures  MDM  Received care of patient from Dr. Belva Crome see his note for prior history, physical and care.  MRI negative for CVA. Awaiting UA given urinary frequency.   UA negative for infection. Given rx for meclizine and rx for amlodipine for blood pressure.        Alvira Monday, MD 06/20/20 2120

## 2020-06-20 LAB — URINE CULTURE: Culture: NO GROWTH

## 2021-09-02 ENCOUNTER — Other Ambulatory Visit (HOSPITAL_BASED_OUTPATIENT_CLINIC_OR_DEPARTMENT_OTHER): Payer: Self-pay

## 2021-09-02 DIAGNOSIS — R400 Somnolence: Secondary | ICD-10-CM
